# Patient Record
Sex: Male | Born: 2000 | Race: White | Hispanic: No | Marital: Single | Smoking: Never smoker
Health system: Southern US, Community
[De-identification: ages and names within clinical notes are randomized; demographics above are authoritative.]

---
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
## 2019-02-12 DIAGNOSIS — Z0100 Encounter for examination of eyes and vision without abnormal findings: Secondary | ICD-10-CM | POA: Diagnosis not present

## 2019-05-08 DIAGNOSIS — Y999 Unspecified external cause status: Secondary | ICD-10-CM | POA: Diagnosis not present

## 2019-05-08 DIAGNOSIS — W1839XA Other fall on same level, initial encounter: Secondary | ICD-10-CM | POA: Diagnosis not present

## 2019-05-08 DIAGNOSIS — R1011 Right upper quadrant pain: Secondary | ICD-10-CM | POA: Diagnosis not present

## 2019-05-08 DIAGNOSIS — R0781 Pleurodynia: Secondary | ICD-10-CM | POA: Diagnosis not present

## 2019-05-12 DIAGNOSIS — J029 Acute pharyngitis, unspecified: Secondary | ICD-10-CM | POA: Diagnosis not present

## 2019-05-23 DIAGNOSIS — K3580 Unspecified acute appendicitis: Secondary | ICD-10-CM | POA: Diagnosis not present

## 2019-05-23 DIAGNOSIS — R1031 Right lower quadrant pain: Secondary | ICD-10-CM | POA: Diagnosis not present

## 2019-05-23 DIAGNOSIS — R112 Nausea with vomiting, unspecified: Secondary | ICD-10-CM | POA: Diagnosis not present

## 2019-05-23 DIAGNOSIS — D72829 Elevated white blood cell count, unspecified: Secondary | ICD-10-CM | POA: Diagnosis not present

## 2019-05-23 DIAGNOSIS — R1084 Generalized abdominal pain: Secondary | ICD-10-CM | POA: Diagnosis not present

## 2019-05-23 DIAGNOSIS — U071 COVID-19: Secondary | ICD-10-CM | POA: Diagnosis not present

## 2019-05-24 DIAGNOSIS — R1031 Right lower quadrant pain: Secondary | ICD-10-CM | POA: Diagnosis not present

## 2019-05-24 DIAGNOSIS — U071 COVID-19: Secondary | ICD-10-CM | POA: Diagnosis not present

## 2019-05-24 DIAGNOSIS — R112 Nausea with vomiting, unspecified: Secondary | ICD-10-CM | POA: Diagnosis not present

## 2019-05-24 DIAGNOSIS — K353 Acute appendicitis with localized peritonitis, without perforation or gangrene: Secondary | ICD-10-CM | POA: Diagnosis not present

## 2019-05-29 DIAGNOSIS — J069 Acute upper respiratory infection, unspecified: Secondary | ICD-10-CM | POA: Diagnosis not present

## 2019-05-29 DIAGNOSIS — Z20828 Contact with and (suspected) exposure to other viral communicable diseases: Secondary | ICD-10-CM | POA: Diagnosis not present
